# Patient Record
Sex: Female | Born: 2000 | Race: Black or African American | Hispanic: No | Marital: Single | Smoking: Never smoker
Health system: Southern US, Community
[De-identification: ages and names within clinical notes are randomized; demographics above are authoritative.]

## PROBLEM LIST (undated history)

## (undated) ENCOUNTER — Ambulatory Visit (HOSPITAL_COMMUNITY): Admission: EM

## (undated) DIAGNOSIS — J45909 Unspecified asthma, uncomplicated: Secondary | ICD-10-CM

## (undated) HISTORY — PX: INNER EAR SURGERY: SHX679

## (undated) HISTORY — PX: HIP SURGERY: SHX245

---

## 2020-01-19 ENCOUNTER — Ambulatory Visit: Payer: Self-pay | Attending: Family

## 2020-01-19 DIAGNOSIS — Z23 Encounter for immunization: Secondary | ICD-10-CM

## 2020-01-29 NOTE — Progress Notes (Signed)
° °  Covid-19 Vaccination Clinic  Name:  Megan Berger    MRN: [id number] DOB: 04/04/2001  01/29/2020  Ms. Megan Berger was observed post Covid-19 immunization for 15 minutes without incident. She was provided with Vaccine Information Sheet and instruction to access the V-Safe system.   Ms. Megan Berger was instructed to call 911 with any severe reactions post vaccine:  Difficulty breathing   Swelling of face and throat   A fast heartbeat   A bad rash all over body   Dizziness and weakness   Immunizations Administered    Name Date Dose VIS Date Route   Pfizer COVID-19 Vaccine 01/19/2020 10:00 AM 0.3 mL 07/19/2018 Intramuscular   Manufacturer: [employer], [employer]   Lot: [id number]   NDC: [address]-7

## 2020-01-31 ENCOUNTER — Encounter (HOSPITAL_COMMUNITY): Payer: Self-pay

## 2020-01-31 ENCOUNTER — Emergency Department (HOSPITAL_COMMUNITY)
Admission: EM | Admit: 2020-01-31 | Discharge: 2020-01-31 | Disposition: A | Discharge status: Home / Self Care | Attending: Emergency Medicine | Admitting: Emergency Medicine

## 2020-01-31 DIAGNOSIS — F10129 Alcohol abuse with intoxication, unspecified: Secondary | ICD-10-CM | POA: Insufficient documentation

## 2020-01-31 DIAGNOSIS — F10920 Alcohol use, unspecified with intoxication, uncomplicated: Secondary | ICD-10-CM

## 2020-01-31 LAB — BASIC METABOLIC PANEL
Anion gap: 12 (ref 5–15)
BUN: 10 mg/dL (ref 6–20)
CO2: 23 mmol/L (ref 22–32)
Calcium: 9.2 mg/dL (ref 8.9–10.3)
Chloride: 104 mmol/L (ref 98–111)
Creatinine, Ser: 0.84 mg/dL (ref 0.44–1.00)
GFR calc Af Amer: >60 mL/min (ref >60)
GFR calc non Af Amer: >60 mL/min (ref >60)
Glucose, Bld: 133 mg/dL — ABNORMAL HIGH (ref 70–99)
Potassium: 3.4 mmol/L — ABNORMAL LOW (ref 3.5–5.1)
Sodium: 139 mmol/L (ref 135–145)

## 2020-01-31 LAB — CBC
HCT: 34.9 % — ABNORMAL LOW (ref 36.0–46.0)
Hemoglobin: 11 g/dL — ABNORMAL LOW (ref 12.0–15.0)
MCH: 25.6 pg — ABNORMAL LOW (ref 26.0–34.0)
MCHC: 31.5 g/dL (ref 30.0–36.0)
MCV: 81.2 fL (ref 80.0–100.0)
Platelets: 242 10*3/uL (ref 150–400)
RBC: 4.3 MIL/uL (ref 3.87–5.11)
RDW: 15.9 % — ABNORMAL HIGH (ref 11.5–15.5)
WBC: 7 10*3/uL (ref 4.0–10.5)
nRBC: 0 % (ref 0.0–0.2)

## 2020-01-31 LAB — RAPID URINE DRUG SCREEN, HOSP PERFORMED
Amphetamines: NOT DETECTED
Barbiturates: NOT DETECTED
Benzodiazepines: NOT DETECTED
Cocaine: NOT DETECTED
Opiates: NOT DETECTED
Tetrahydrocannabinol: NOT DETECTED

## 2020-01-31 LAB — ETHANOL: Alcohol, Ethyl (B): 160 mg/dL — ABNORMAL HIGH (ref <10)

## 2020-01-31 MED ORDER — SODIUM CHLORIDE 0.9 % IV BOLUS
1000.0000 mL | Freq: Once | INTRAVENOUS | Status: AC
  Administered 2020-01-31: 1000 mL via INTRAVENOUS
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
## 2020-01-31 NOTE — ED Notes (Signed)
Pt independently ambulatory holding IV pole for assistance with steady gait.

## 2021-02-16 ENCOUNTER — Other Ambulatory Visit: Payer: Self-pay

## 2021-02-16 ENCOUNTER — Ambulatory Visit: Admission: EM | Admit: 2021-02-16 | Discharge: 2021-02-16 | Disposition: A | Discharge status: Home / Self Care

## 2021-02-16 DIAGNOSIS — Z20822 Contact with and (suspected) exposure to covid-19: Secondary | ICD-10-CM

## 2021-02-16 DIAGNOSIS — J069 Acute upper respiratory infection, unspecified: Secondary | ICD-10-CM

## 2021-02-16 MED ORDER — DM-GUAIFENESIN ER 30-600 MG PO TB12: 1.0000 | ORAL_TABLET | Freq: Two times a day (BID) | ORAL | 0 refills | Status: AC

## 2021-02-16 NOTE — Discharge Instructions (Addendum)
You likely having a viral upper respiratory infection. We recommended symptom control. I expect your symptoms to start improving in the next 1-2 weeks.   1. Take a daily allergy pill/anti-histamine like Zyrtec, Claritin, or Store brand consistently for 2 weeks  2. For congestion you may try an oral decongestant like sudafed. You may also try intranasal flonase nasal spray or saline irrigations (neti pot, sinus cleanse)  3. For your sore throat you may try cepacol lozenges, salt water gargles, throat spray. Treatment of congestion may also help your sore throat.  4. For cough you may try the cough medication that has been prescribed for you.  5. Take Tylenol or Ibuprofen to help with pain/inflammation  6. Stay hydrated, drink plenty of fluids to keep throat coated and less irritated  Honey Tea For cough/sore throat try using a honey-based tea. Use 3 teaspoons of honey with juice squeezed from half lemon. Place shaved pieces of ginger into 1/2-1 cup of water and warm over stove top. Then mix the ingredients and repeat every 4 hours as needed.   Your COVID-19 test is pending.  We will call if it is positive.

## 2021-02-16 NOTE — ED Provider Notes (Signed)
[facility]    CSN: [id number] Arrival date & time: 02/16/21  0913      History   Chief Complaint Chief Complaint  Patient presents with   Cough    HPI Megan Berger is a 20 y.o. female.   Patient presents with 4-day history of cough and nasal congestion.  Cough is productive with clear to yellow sputum.  Denies chest pain, shortness of breath, sore throat, fever, any known sick contacts.  Has been taking over-the-counter Delsym with minimal relief in symptoms.   Cough  History reviewed. No pertinent past medical history.  There are no problems to display for this patient.   History reviewed. No pertinent surgical history.  OB History   No obstetric history on file.      Home Medications    Prior to Admission medications   Medication Sig Start Date End Date Taking? Authorizing Provider  cetirizine (ZYRTEC) 10 MG tablet  06/25/20  Yes [provider]  ciprofloxacin-dexamethasone (CIPRODEX) OTIC suspension  = 1 drop(s), BID, # 7.5 mL, 0 total refill(s), Hard Stop, DAW [Last filled 01/21/21] 01/21/21 01/20/22 Yes [provider]  dextromethorphan-guaiFENesin (MUCINEX DM) 30-600 MG 12hr tablet Take 1 tablet by mouth 2 (two) times daily. 02/16/21  Yes Lance Muss, FNP  fluticasone Aleda Grana) 50 MCG/ACT nasal spray  06/25/20  Yes [provider]    Family History History reviewed. No pertinent family history.  Social History Social History   Tobacco Use   Smoking status: Never   Smokeless tobacco: Never  Substance Use Topics   Alcohol use: Yes   Drug use: Never     Allergies   Latex   Review of Systems Review of Systems Per HPI  Physical Exam Triage Vital Signs ED Triage Vitals  Enc Vitals Group     BP 02/16/21 1053 118/77     Pulse Rate 02/16/21 1053 (!) 105     Resp 02/16/21 1053 18     Temp 02/16/21 1053 97.9 F (36.6 C)     Temp Source 02/16/21 1053 Oral     SpO2 02/16/21 1053 98 %     Weight --       Height --      Head Circumference --      Peak Flow --      Pain Score 02/16/21 1055 0     Pain Loc --      Pain Edu? --      Excl. in GC? --    No data found.  Updated Vital Signs BP 118/77 (BP Location: Right Arm)   Pulse (!) 105   Temp 97.9 F (36.6 C) (Oral)   Resp 18   LMP 01/26/2021 (Approximate)   SpO2 98%   Visual Acuity Right Eye Distance:   Left Eye Distance:   Bilateral Distance:    Right Eye Near:   Left Eye Near:    Bilateral Near:     Physical Exam Constitutional:      General: She is not in acute distress.    Appearance: Normal appearance.  HENT:     Head: Normocephalic and atraumatic.     Right Ear: Tympanic membrane and ear canal normal.     Left Ear: Tympanic membrane and ear canal normal.     Nose: Congestion present.     Mouth/Throat:     Mouth: Mucous membranes are moist.     Pharynx: No posterior oropharyngeal erythema.  Eyes:     Extraocular Movements: Extraocular  movements intact.     Conjunctiva/sclera: Conjunctivae normal.     Pupils: Pupils are equal, round, and reactive to light.  Cardiovascular:     Rate and Rhythm: Normal rate and regular rhythm.     Pulses: Normal pulses.     Heart sounds: Normal heart sounds.  Pulmonary:     Effort: Pulmonary effort is normal. No respiratory distress.     Breath sounds: Normal breath sounds. No stridor. No wheezing or rhonchi.  Abdominal:     General: Abdomen is flat. Bowel sounds are normal.     Palpations: Abdomen is soft.  Musculoskeletal:        General: Normal range of motion.     Cervical back: Normal range of motion.  Skin:    General: Skin is warm and dry.  Neurological:     General: No focal deficit present.     Mental Status: She is alert and oriented to person, place, and time. Mental status is at baseline.  Psychiatric:        Mood and Affect: Mood normal.        Behavior: Behavior normal.     UC Treatments / Results  Labs (all labs ordered are listed, but only abnormal  results are displayed) Labs Reviewed  NOVEL CORONAVIRUS, NAA    EKG   Radiology No results found.  Procedures Procedures (including critical care time)  Medications Ordered in UC Medications - No data to display  Initial Impression / Assessment and Plan / UC Course  I have reviewed the triage vital signs and the nursing notes.  Pertinent labs & imaging results that were available during my care of the patient were reviewed by me and considered in my medical decision making (see chart for details).     Patient presents with symptoms likely from a viral upper respiratory infection. Differential includes bacterial pneumonia, sinusitis, allergic rhinitis, Covid 19. Do not suspect underlying cardiopulmonary process. Symptoms seem unlikely related to ACS, CHF or COPD exacerbations, pneumonia, pneumothorax. Patient is nontoxic appearing and not in need of emergent medical intervention.  Recommended symptom control with over the counter medications: Daily oral anti-histamine, Oral decongestant or IN corticosteroid, saline irrigations, cepacol lozenges, honey tea.  Patient prescribed Mucinex DM to help alleviate symptoms.  COVID-19 viral swab pending.  Return if symptoms fail to improve in 1-2 weeks or you develop shortness of breath, chest pain, severe headache. Patient states understanding and is agreeable.  Discharged with PCP followup.  Final Clinical Impressions(s) / UC Diagnoses   Final diagnoses:  Viral upper respiratory tract infection with cough  Encounter for laboratory testing for COVID-19 virus     Discharge Instructions      You likely having a viral upper respiratory infection. We recommended symptom control. I expect your symptoms to start improving in the next 1-2 weeks.   1. Take a daily allergy pill/anti-histamine like Zyrtec, Claritin, or Store brand consistently for 2 weeks  2. For congestion you may try an oral decongestant like sudafed. You may also try  intranasal flonase nasal spray or saline irrigations (neti pot, sinus cleanse)  3. For your sore throat you may try cepacol lozenges, salt water gargles, throat spray. Treatment of congestion may also help your sore throat.  4. For cough you may try the cough medication that has been prescribed for you.  5. Take Tylenol or Ibuprofen to help with pain/inflammation  6. Stay hydrated, drink plenty of fluids to keep throat coated and less irritated  Honey  Tea For cough/sore throat try using a honey-based tea. Use 3 teaspoons of honey with juice squeezed from half lemon. Place shaved pieces of ginger into 1/2-1 cup of water and warm over stove top. Then mix the ingredients and repeat every 4 hours as needed.   Your COVID-19 test is pending.  We will call if it is positive.     ED Prescriptions     Medication Sig Dispense Auth. Provider   dextromethorphan-guaiFENesin (MUCINEX DM) 30-600 MG 12hr tablet Take 1 tablet by mouth 2 (two) times daily. 30 tablet Lance Muss, FNP      PDMP not reviewed this encounter.   Lance Muss, FNP 02/16/21 1149

## 2021-02-16 NOTE — ED Triage Notes (Signed)
4 day h/o cough. Pt reports at the onset the cough was productive and has since transitioned to non productive. Has been taking OTC cough meds without relief. Pt is covid vaccinated.

## 2021-02-17 LAB — SARS-COV-2, NAA 2 DAY TAT

## 2021-02-17 LAB — NOVEL CORONAVIRUS, NAA: SARS-CoV-2, NAA: NOT DETECTED

## 2021-07-22 ENCOUNTER — Emergency Department (HOSPITAL_COMMUNITY)
Admission: EM | Admit: 2021-07-22 | Discharge: 2021-07-22 | Disposition: A | Discharge status: Home / Self Care | Attending: Emergency Medicine | Admitting: Emergency Medicine

## 2021-07-22 ENCOUNTER — Emergency Department (HOSPITAL_COMMUNITY)
Admission: EM | Admit: 2021-07-22 | Discharge: 2021-07-22 | Disposition: A | Discharge status: Home / Self Care | Source: Home / Self Care | Attending: Emergency Medicine | Admitting: Emergency Medicine

## 2021-07-22 ENCOUNTER — Encounter (HOSPITAL_COMMUNITY): Payer: Self-pay

## 2021-07-22 ENCOUNTER — Emergency Department (HOSPITAL_COMMUNITY)

## 2021-07-22 ENCOUNTER — Encounter (HOSPITAL_COMMUNITY): Payer: Self-pay | Admitting: *Deleted

## 2021-07-22 ENCOUNTER — Other Ambulatory Visit: Payer: Self-pay

## 2021-07-22 DIAGNOSIS — Y92410 Unspecified street and highway as the place of occurrence of the external cause: Secondary | ICD-10-CM | POA: Insufficient documentation

## 2021-07-22 DIAGNOSIS — Z91040 Latex allergy status: Secondary | ICD-10-CM | POA: Insufficient documentation

## 2021-07-22 DIAGNOSIS — M791 Myalgia, unspecified site: Secondary | ICD-10-CM | POA: Diagnosis not present

## 2021-07-22 DIAGNOSIS — M79605 Pain in left leg: Secondary | ICD-10-CM | POA: Diagnosis not present

## 2021-07-22 DIAGNOSIS — Z5321 Procedure and treatment not carried out due to patient leaving prior to being seen by health care provider: Secondary | ICD-10-CM | POA: Insufficient documentation

## 2021-07-22 DIAGNOSIS — M79602 Pain in left arm: Secondary | ICD-10-CM | POA: Insufficient documentation

## 2021-07-22 HISTORY — DX: Unspecified asthma, uncomplicated: J45.909

## 2021-07-22 MED ORDER — LIDOCAINE 5 % EX PTCH
1.0000 | MEDICATED_PATCH | CUTANEOUS | Status: DC
  Administered 2021-07-22: 1 via TRANSDERMAL
  Filled 2021-07-22: qty 1

## 2021-07-22 MED ORDER — IBUPROFEN 200 MG PO TABS
600.0000 mg | ORAL_TABLET | Freq: Once | ORAL | Status: AC
  Administered 2021-07-22: 600 mg via ORAL
  Filled 2021-07-22: qty 3
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
## 2021-07-22 NOTE — Discharge Instructions (Signed)
Take Tylenol Motrin for pain.  The x-rays today were negative.
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
## 2021-07-26 ENCOUNTER — Encounter (HOSPITAL_COMMUNITY): Payer: Self-pay | Admitting: Emergency Medicine

## 2021-07-26 ENCOUNTER — Other Ambulatory Visit: Payer: Self-pay

## 2021-07-26 ENCOUNTER — Ambulatory Visit (HOSPITAL_COMMUNITY)
Admission: EM | Admit: 2021-07-26 | Discharge: 2021-07-26 | Disposition: A | Discharge status: Home / Self Care | Attending: Emergency Medicine | Admitting: Emergency Medicine

## 2021-07-26 DIAGNOSIS — B3731 Acute candidiasis of vulva and vagina: Secondary | ICD-10-CM | POA: Insufficient documentation

## 2021-07-26 LAB — POCT URINALYSIS DIPSTICK, ED / UC
Bilirubin Urine: NEGATIVE
Glucose, UA: NEGATIVE mg/dL
Hgb urine dipstick: NEGATIVE
Ketones, ur: NEGATIVE mg/dL
Leukocytes,Ua: NEGATIVE
Nitrite: NEGATIVE
Protein, ur: NEGATIVE mg/dL
Specific Gravity, Urine: >=1.03 (ref 1.005–1.030)
Urobilinogen, UA: 0.2 mg/dL (ref 0.0–1.0)
pH: 5.5 (ref 5.0–8.0)

## 2021-07-26 LAB — POC URINE PREG, ED: Preg Test, Ur: NEGATIVE

## 2021-07-26 MED ORDER — FLUCONAZOLE 150 MG PO TABS: ORAL_TABLET | ORAL | 3 refills | Status: AC

## 2021-07-26 NOTE — ED Provider Notes (Signed)
[facility]    CSN: [id number] Arrival date & time: 07/26/21  1534    HISTORY   Chief Complaint  Patient presents with   Vaginal Itching   HPI Megan Berger is a 21 y.o. female. Pt complains of vaginal itching, unusual vaginal odor (thinks it smells like eggs) and discharge x 2 days. States she thinks that she may have a yeast infection.  Patient states she is sexually active but using condoms at this time.  She denies burning with urination, increased frequency of urination, pelvic pain, pelvic pressure, incomplete emptying, fever, aches, chills, nausea, vomiting, diarrhea, breast tenderness.  Patient states that she is unaware of any known STD exposures.  Patient denies history of STD infection.  The history is provided by the patient.  Past Medical History:  Diagnosis Date   Asthma    There are no problems to display for this patient.  Past Surgical History:  Procedure Laterality Date   HIP SURGERY     INNER EAR SURGERY     OB History   No obstetric history on file.    Home Medications    Prior to Admission medications   Medication Sig Start Date End Date Taking? Authorizing Provider  cetirizine (ZYRTEC) 10 MG tablet  06/25/20   [provider]  ciprofloxacin-dexamethasone (CIPRODEX) OTIC suspension  = 1 drop(s), BID, # 7.5 mL, 0 total refill(s), Hard Stop, DAW [Last filled 01/21/21] 01/21/21 01/20/22  [provider]  dextromethorphan-guaiFENesin (MUCINEX DM) 30-600 MG 12hr tablet Take 1 tablet by mouth 2 (two) times daily. 02/16/21   Gustavus Bryant, FNP  fluticasone Aleda Grana) 50 MCG/ACT nasal spray  06/25/20   [provider]   Family History Family History  Problem Relation Age of Onset   Asthma Mother    Social History Social History   Tobacco Use   Smoking status: Never   Smokeless tobacco: Never  Vaping Use   Vaping Use: Never used  Substance Use Topics   Alcohol use: Yes   Drug use: Never   Allergies    Latex  Review of Systems Review of Systems Pertinent findings noted in history of present illness.   Physical Exam Triage Vital Signs ED Triage Vitals  Enc Vitals Group     BP 03/21/21 0827 (!) 147/82     Pulse Rate 03/21/21 0827 72     Resp 03/21/21 0827 18     Temp 03/21/21 0827 98.3 F (36.8 C)     Temp Source 03/21/21 0827 Oral     SpO2 03/21/21 0827 98 %     Weight --      Height --      Head Circumference --      Peak Flow --      Pain Score 03/21/21 0826 5     Pain Loc --      Pain Edu? --      Excl. in GC? --   No data found.  Updated Vital Signs BP 120/72 (BP Location: Left Arm)    Pulse 80    Resp 18    Ht 5' 1.5" (1.562 m)    Wt 152 lb 1 oz (69 kg)    LMP 06/26/2021 (Approximate)    SpO2 98%    BMI 28.27 kg/m   Physical Exam Vitals and nursing note reviewed.  Constitutional:      General: She is not in acute distress.    Appearance: Normal appearance. She is not ill-appearing.  HENT:  Head: Normocephalic and atraumatic.  Eyes:     General: Lids are normal.        Right eye: No discharge.        Left eye: No discharge.     Extraocular Movements: Extraocular movements intact.     Conjunctiva/sclera: Conjunctivae normal.     Right eye: Right conjunctiva is not injected.     Left eye: Left conjunctiva is not injected.  Neck:     Trachea: Trachea and phonation normal.  Cardiovascular:     Rate and Rhythm: Normal rate and regular rhythm.     Pulses: Normal pulses.     Heart sounds: Normal heart sounds. No murmur heard.   No friction rub. No gallop.  Pulmonary:     Effort: Pulmonary effort is normal. No accessory muscle usage, prolonged expiration or respiratory distress.     Breath sounds: Normal breath sounds. No stridor, decreased air movement or transmitted upper airway sounds. No decreased breath sounds, wheezing, rhonchi or rales.  Chest:     Chest wall: No tenderness.  Genitourinary:    Comments: Patient politely declines pelvic exam today,  patient provided a vaginal swab for testing. Musculoskeletal:        General: Normal range of motion.     Cervical back: Normal range of motion and neck supple. Normal range of motion.  Lymphadenopathy:     Cervical: No cervical adenopathy.  Skin:    General: Skin is warm and dry.     Findings: No erythema or rash.  Neurological:     General: No focal deficit present.     Mental Status: She is alert and oriented to person, place, and time.  Psychiatric:        Mood and Affect: Mood normal.        Behavior: Behavior normal.    Visual Acuity Right Eye Distance:   Left Eye Distance:   Bilateral Distance:    Right Eye Near:   Left Eye Near:    Bilateral Near:     UC Couse / Diagnostics / Procedures:    EKG  Radiology No results found.  Procedures Procedures (including critical care time)  UC Diagnoses / Final Clinical Impressions(s)   I have reviewed the triage vital signs and the nursing notes.  Pertinent labs & imaging results that were available during my care of the patient were reviewed by me and considered in my medical decision making (see chart for details).    Final diagnoses:  Vulvovaginitis candida albicans   Patient was provided with Diflucan 150 mg every 3 days x 2 for empiric treatment of presumed vulvovaginal candidiasis based on the history provided to me today. STD screening was performed, patient advised that the results be posted to their MyChart and if any of the results are positive, they will be notified by phone, further treatment will be provided as indicated based on results of STD screening. Return precautions advised.  Drug allergies reviewed, all questions addressed.     ED Prescriptions     Medication Sig Dispense Auth. Provider   fluconazole (DIFLUCAN) 150 MG tablet Take 1 tablet today.  Take second tablet 3 days later. 2 tablet Theadora RamaMorgan, Juanice Warburton Scales, PA-C      PDMP not reviewed this encounter.  Pending results:  Labs Reviewed   POCT URINALYSIS DIP (MANUAL ENTRY)  POCT URINE PREGNANCY  POCT URINALYSIS DIPSTICK, ED / UC  POC URINE PREG, ED  CERVICOVAGINAL ANCILLARY ONLY    Medications Ordered in UC:  Medications - No data to display  Disposition Upon Discharge:  Condition: stable for discharge home  Patient presented with concern for an acute illness with associated systemic symptoms and significant discomfort requiring urgent management. In my opinion, this is a condition that a prudent lay person (someone who possesses an average knowledge of health and medicine) may potentially expect to result in complications if not addressed urgently such as respiratory distress, impairment of bodily function or dysfunction of bodily organs.   As such, the patient has been evaluated and assessed, work-up was performed and treatment was provided in alignment with urgent care protocols and evidence based medicine.  Patient/parent/caregiver has been advised that the patient may require follow up for further testing and/or treatment if the symptoms continue in spite of treatment, as clinically indicated and appropriate.  Routine symptom specific, illness specific and/or disease specific instructions were discussed with the patient and/or caregiver at length.  Prevention strategies for avoiding STD exposure were also discussed.  The patient will follow up with their current PCP if and as advised. If the patient does not currently have a PCP we will assist them in obtaining one.   The patient may need specialty follow up if the symptoms continue, in spite of conservative treatment and management, for further workup, evaluation, consultation and treatment as clinically indicated and appropriate.  Patient/parent/caregiver verbalized understanding and agreement of plan as discussed.  All questions were addressed during visit.  Please see discharge instructions below for further details of plan.  Discharge Instructions:   Discharge  Instructions      It was very nice to meet you today, thank you for visiting urgent care today.  Based on the history that you provided to me today, you were treated empirically for vaginal candidiasis (yeast infection) with fluconazole (Diflucan), take the first tablet today and take the second tablet three days after the first tablet.  Please abstain from sexual intercourse, tampon use while being treated.  Prescription has been sent to your pharmacy, please pick it up as soon as possible.   The results of your STD testing today will be made available to you once they are complete, this typically takes 3 to 5 days.  The STD testing we performed here at urgent care includes yeast, bacterial vaginosis (BV), gonorrhea, chlamydia, trichomonas.  The results will initially be posted to your MyChart and, if any of your results are abnormal, you will receive a phone call with those results along with further instructions regarding any further treatment, if needed.    Your urinalysis and urine pregnancy test today were both negative.  Renee Rival!)   Please remember that the only way to prevent transmission of sexually transmitted disease when having sexual intercourse is to use condoms.  Repeat sexually transmitted infections can cause scarring in your fallopian tubes which will interfere with your ability to conceive later in life.  Repeat exposures to sexually transmitted diseases can also increase your risk of human papilloma virus which causes cervical cancer and genital warts.  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   Thank you again for visiting urgent care today.  I appreciate the opportunity to participate in your care.     This office note has been dictated using Teaching laboratory technician.  Unfortunately, and despite my best  efforts, this method of  dictation can sometimes lead to occasional typographical or grammatical errors.  I apologize in advance if this occurs.      Theadora Rama Scales, [place] 07/26/21 [phone]

## 2021-07-26 NOTE — Discharge Instructions (Addendum)
It was very nice to meet you today, thank you for visiting urgent care today. ? ?Based on the history that you provided to me today, you were treated empirically for vaginal candidiasis (yeast infection) with fluconazole (Diflucan), take the first tablet today and take the second tablet three days after the first tablet.  Please abstain from sexual intercourse, tampon use while being treated.  Prescription has been sent to your pharmacy, please pick it up as soon as possible. ?  ?The results of your STD testing today will be made available to you once they are complete, this typically takes 3 to 5 days.  The STD testing we performed here at urgent care includes yeast, bacterial vaginosis (BV), gonorrhea, chlamydia, trichomonas.  The results will initially be posted to your MyChart and, if any of your results are abnormal, you will receive a phone call with those results along with further instructions regarding any further treatment, if needed.   ? ?Your urinalysis and urine pregnancy test today were both negative.  Renee Rival!) ?  ?Please remember that the only way to prevent transmission of sexually transmitted disease when having sexual intercourse is to use condoms.  Repeat sexually transmitted infections can cause scarring in your fallopian tubes which will interfere with your ability to conceive later in life.  Repeat exposures to sexually transmitted diseases can also increase your risk of human papilloma virus which causes cervical cancer and genital warts.  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  ?If you have not had complete resolution of your symptoms after completing treatment, please return for repeat evaluation. ?  ?Thank you again for visiting urgent care today.  I appreciate the opportunity to participate in your care. ? ?

## 2021-07-26 NOTE — ED Triage Notes (Signed)
Pt reports vaginal itching, odor and discharge x 2 days. States she think it is a yeast infection.  ?

## 2021-07-28 LAB — CERVICOVAGINAL ANCILLARY ONLY
Bacterial Vaginitis (gardnerella): POSITIVE — AB
Candida Glabrata: NEGATIVE
Candida Vaginitis: POSITIVE — AB
Chlamydia: NEGATIVE
Comment: NEGATIVE
Comment: NEGATIVE
Comment: NEGATIVE
Comment: NEGATIVE
Comment: NEGATIVE
Comment: NORMAL
Neisseria Gonorrhea: NEGATIVE
Trichomonas: NEGATIVE

## 2021-07-29 ENCOUNTER — Telehealth (HOSPITAL_COMMUNITY): Payer: Self-pay | Admitting: Emergency Medicine

## 2021-07-29 MED ORDER — METRONIDAZOLE 500 MG PO TABS: 500.0000 mg | ORAL_TABLET | Freq: Two times a day (BID) | ORAL | 0 refills | Status: AC

## 2022-01-11 ENCOUNTER — Encounter (HOSPITAL_COMMUNITY): Payer: Self-pay | Admitting: Emergency Medicine

## 2022-01-11 ENCOUNTER — Emergency Department (HOSPITAL_COMMUNITY)
Admission: EM | Admit: 2022-01-11 | Discharge: 2022-01-11 | Disposition: A | Discharge status: Home / Self Care | Attending: Emergency Medicine | Admitting: Emergency Medicine

## 2022-01-11 ENCOUNTER — Other Ambulatory Visit: Payer: Self-pay

## 2022-01-11 DIAGNOSIS — X58XXXA Exposure to other specified factors, initial encounter: Secondary | ICD-10-CM | POA: Insufficient documentation

## 2022-01-11 DIAGNOSIS — J45909 Unspecified asthma, uncomplicated: Secondary | ICD-10-CM | POA: Insufficient documentation

## 2022-01-11 DIAGNOSIS — T192XXA Foreign body in vulva and vagina, initial encounter: Secondary | ICD-10-CM | POA: Insufficient documentation

## 2022-01-11 DIAGNOSIS — Z91040 Latex allergy status: Secondary | ICD-10-CM | POA: Diagnosis not present

## 2022-01-11 DIAGNOSIS — N898 Other specified noninflammatory disorders of vagina: Secondary | ICD-10-CM | POA: Diagnosis not present

## 2022-01-11 LAB — WET PREP, GENITAL
Clue Cells Wet Prep HPF POC: NONE SEEN
Sperm: NONE SEEN
Trich, Wet Prep: NONE SEEN
WBC, Wet Prep HPF POC: <10 (ref <10)
Yeast Wet Prep HPF POC: NONE SEEN

## 2022-01-11 NOTE — ED Triage Notes (Addendum)
Patient reports unable to manually remove condom inside her vaginal canal after sexual encounter this morning .

## 2022-01-11 NOTE — Discharge Instructions (Signed)
You came to the emergency department today due to a vaginal foreign body.  The foreign body was removed without incident.  Your testing was negative for trichomonas, bacterial vaginosis, and yeast infection.  Today you have been tested for gonorrhea and chlamydia.  The test to determine if you have these will take a few days. They will only call you if your tests come back positive, no news is good news. IOn your discharge information there is also instructions on how to sign up for my chart.  Please complete this so that you can follow the results on your own also.  If positive please follow-up with the [facility] department, your primary care doctor, urgent care, or return to the emergency department for treatment.    Please do not have any sexual activity for the next two weeks.  After that please make sure that you always use a condom every time you have sex.  If you have any new or concerning symptoms please seek additional medical care and evaluation.     Get help right away if: You have heavy vaginal bleeding or discharge. You have severe abdominal pain.

## 2022-01-11 NOTE — ED Provider Notes (Signed)
[facility] EMERGENCY DEPARTMENT Provider Note   CSN: [id number] Arrival date & time: 01/11/22  0251     History  Chief Complaint  Patient presents with   Condom Malfunction     Megan Berger is a 21 y.o. female with medical history of asthma.  Patient presents to the emergency department due to foreign body within vaginal vault.  Patient states that she was having penetrative vaginal intercourse with her boyfriend when the condom he was using accidentally became removed and stuck within her vaginal vault.  Patient has not been able to remove this.  Patient endorses vaginal discharge however states that this is not abnormal for her.  Denies any vaginal pain, vaginal bleeding, abdominal pain, nausea, vomiting.  Patient reports she is not on any forms of birth control.  LMP last week  HPI     Home Medications Prior to Admission medications   Medication Sig Start Date End Date Taking? Authorizing Provider  metroNIDAZOLE (FLAGYL) 500 MG tablet Take 1 tablet (500 mg total) by mouth 2 (two) times daily. 07/29/21   Merrilee Jansky, MD  cetirizine (ZYRTEC) 10 MG tablet  06/25/20   [provider]  ciprofloxacin-dexamethasone (CIPRODEX) OTIC suspension  = 1 drop(s), BID, # 7.5 mL, 0 total refill(s), Hard Stop, DAW [Last filled 01/21/21] 01/21/21 01/20/22  [provider]  dextromethorphan-guaiFENesin (MUCINEX DM) 30-600 MG 12hr tablet Take 1 tablet by mouth 2 (two) times daily. 02/16/21   Gustavus Bryant, FNP  fluconazole (DIFLUCAN) 150 MG tablet Take 1 tablet today.  Take second tablet 3 days later. 07/26/21   Theadora Rama Scales, PA-C  fluticasone Aleda Grana) 50 MCG/ACT nasal spray  06/25/20   [provider]      Allergies    Latex    Review of Systems   Review of Systems  Constitutional:  Negative for chills and fever.  Gastrointestinal:  Negative for abdominal pain, nausea and vomiting.  Genitourinary:  Positive for vaginal discharge.  Negative for vaginal bleeding and vaginal pain.    Physical Exam Updated Vital Signs BP (!) 148/71 (BP Location: Right Arm)   Pulse 73   Temp 97.7 F (36.5 C) (Oral)   Resp 17   SpO2 99%  Physical Exam Vitals and nursing note reviewed.  Constitutional:      General: She is not in acute distress.    Appearance: She is not ill-appearing, toxic-appearing or diaphoretic.  HENT:     Head: Normocephalic.  Eyes:     General: No scleral icterus.       Right eye: No discharge.        Left eye: No discharge.  Cardiovascular:     Rate and Rhythm: Normal rate.  Pulmonary:     Effort: Pulmonary effort is normal.  Abdominal:     General: Abdomen is flat. Bowel sounds are normal.     Palpations: Abdomen is soft. There is no mass or pulsatile mass.     Tenderness: There is no abdominal tenderness. There is no guarding or rebound.     Hernia: There is no hernia in the umbilical area or ventral area.  Genitourinary:    Vagina: Foreign body present. No signs of injury. No vaginal discharge, erythema, tenderness, bleeding, lesions or prolapsed vaginal walls.     Cervix: No discharge, lesion or cervical bleeding.     Comments: Condom found within vaginal vault.  No vaginal bleeding or vaginal discharge noted within vaginal vault. Skin:    General: Skin  is warm and dry.  Neurological:     General: No focal deficit present.     Mental Status: She is alert.  Psychiatric:        Behavior: Behavior is cooperative.     ED Results / Procedures / Treatments   Labs (all labs ordered are listed, but only abnormal results are displayed) Labs Reviewed - No data to display  EKG None  Radiology No results found.  Procedures Procedures    Medications Ordered in ED Medications - No data to display  ED Course/ Medical Decision Making/ A&P                           Medical Decision Making Amount and/or Complexity of Data Reviewed Labs: ordered.   Alert 21 year old female no acute  distress, nontoxic appearing.  Presents to the emergency department with a chief complaint of foreign body within vaginal vault.  Information obtained from patient and patient's boyfriend at bedside.  I reviewed patient's past medical records including previous prior notes and labs.  Patient is requesting STI testing.  Patient was offered testing for HIV and syphilis have declined at this time.  Low suspicion for pregnancy at this time with last menstrual period occurring last week.  Patient was counseled on use of emergency contraceptive if there was any concern for possible pregnancy from condom malfunction but occurred today.  Condom was found within vaginal vault and removed without incident.  I personally viewed and interpreted patient's lab results.  Patient is negative for trichomonas, yeast infection, and bacterial vaginosis.  Based on patient's chief complaint, I considered admission might be necessary, however after reassuring ED workup feel patient is reasonable for discharge.  Discussed results, findings, treatment and follow up. Patient advised of return precautions. Patient verbalized understanding and agreed with plan.  Portions of this note were generated with Scientist, clinical (histocompatibility and immunogenetics). Dictation errors may occur despite best attempts at proofreading.         Final Clinical Impression(s) / ED Diagnoses Final diagnoses:  None    Rx / DC Orders ED Discharge Orders     None         Berneice Heinrich 01/11/22 [id number]    Gloris Manchester, MD 01/13/22 [phone]

## 2022-01-12 LAB — GC/CHLAMYDIA PROBE AMP (~~LOC~~) NOT AT ARMC
Chlamydia: NEGATIVE
Comment: NEGATIVE
Comment: NORMAL
Neisseria Gonorrhea: NEGATIVE
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
